# Patient Record
Sex: Male | Born: 1935 | Race: White | Hispanic: No | Marital: Married | Smoking: Never smoker
Health system: Southern US, Community
[De-identification: ages and names within clinical notes are randomized; demographics above are authoritative.]

## PROBLEM LIST (undated history)

## (undated) DIAGNOSIS — E785 Hyperlipidemia, unspecified: Secondary | ICD-10-CM

## (undated) DIAGNOSIS — I10 Essential (primary) hypertension: Secondary | ICD-10-CM

## (undated) DIAGNOSIS — L409 Psoriasis, unspecified: Secondary | ICD-10-CM

## (undated) DIAGNOSIS — H3530 Unspecified macular degeneration: Secondary | ICD-10-CM

## (undated) HISTORY — DX: Psoriasis, unspecified: L40.9

## (undated) HISTORY — DX: Hyperlipidemia, unspecified: E78.5

## (undated) HISTORY — PX: TONSILLECTOMY AND ADENOIDECTOMY: SUR1326

## (undated) HISTORY — PX: OTHER SURGICAL HISTORY: SHX169

## (undated) HISTORY — DX: Unspecified macular degeneration: H35.30

## (undated) HISTORY — PX: INGUINAL HERNIA REPAIR: SHX194

## (undated) HISTORY — DX: Essential (primary) hypertension: I10

---

## 1999-08-07 ENCOUNTER — Ambulatory Visit (HOSPITAL_COMMUNITY): Admission: RE | Admit: 1999-08-07 | Discharge: 1999-08-07 | Payer: Self-pay | Admitting: Gastroenterology

## 1999-11-01 ENCOUNTER — Encounter (HOSPITAL_BASED_OUTPATIENT_CLINIC_OR_DEPARTMENT_OTHER): Payer: Self-pay | Admitting: General Surgery

## 1999-11-04 ENCOUNTER — Inpatient Hospital Stay (HOSPITAL_COMMUNITY): Admission: RE | Admit: 1999-11-04 | Discharge: 1999-11-04 | Payer: Self-pay | Admitting: General Surgery

## 2002-04-08 ENCOUNTER — Emergency Department (HOSPITAL_COMMUNITY): Admission: EM | Admit: 2002-04-08 | Discharge: 2002-04-08 | Payer: Self-pay | Admitting: Emergency Medicine

## 2005-01-13 ENCOUNTER — Ambulatory Visit: Payer: Self-pay | Admitting: Internal Medicine

## 2005-02-17 ENCOUNTER — Ambulatory Visit: Payer: Self-pay | Admitting: Internal Medicine

## 2005-02-26 ENCOUNTER — Ambulatory Visit: Payer: Self-pay | Admitting: Internal Medicine

## 2005-04-22 ENCOUNTER — Ambulatory Visit: Payer: Self-pay | Admitting: Internal Medicine

## 2005-06-17 ENCOUNTER — Ambulatory Visit: Payer: Self-pay | Admitting: Internal Medicine

## 2005-06-27 ENCOUNTER — Ambulatory Visit: Payer: Self-pay | Admitting: Internal Medicine

## 2005-08-21 ENCOUNTER — Ambulatory Visit: Payer: Self-pay | Admitting: Internal Medicine

## 2005-08-26 ENCOUNTER — Ambulatory Visit: Payer: Self-pay | Admitting: Internal Medicine

## 2005-09-08 HISTORY — PX: COLONOSCOPY: SHX174

## 2005-10-16 ENCOUNTER — Ambulatory Visit: Payer: Self-pay | Admitting: Internal Medicine

## 2005-12-16 ENCOUNTER — Ambulatory Visit: Payer: Self-pay | Admitting: Internal Medicine

## 2005-12-25 ENCOUNTER — Ambulatory Visit: Payer: Self-pay | Admitting: Internal Medicine

## 2006-03-25 ENCOUNTER — Ambulatory Visit: Payer: Self-pay | Admitting: Internal Medicine

## 2006-04-01 ENCOUNTER — Ambulatory Visit: Payer: Self-pay | Admitting: Internal Medicine

## 2006-06-03 ENCOUNTER — Ambulatory Visit: Payer: Self-pay | Admitting: Internal Medicine

## 2006-06-10 ENCOUNTER — Ambulatory Visit: Payer: Self-pay | Admitting: Internal Medicine

## 2006-07-01 ENCOUNTER — Ambulatory Visit: Payer: Self-pay | Admitting: Gastroenterology

## 2006-07-15 ENCOUNTER — Ambulatory Visit: Payer: Self-pay | Admitting: Gastroenterology

## 2006-11-23 DIAGNOSIS — L408 Other psoriasis: Secondary | ICD-10-CM

## 2006-11-24 ENCOUNTER — Ambulatory Visit: Payer: Self-pay | Admitting: Internal Medicine

## 2006-11-24 LAB — CONVERTED CEMR LAB
ALT: 18 units/L (ref 0–40)
AST: 27 units/L (ref 0–37)
Cholesterol: 171 mg/dL (ref 0–200)
HDL: 42.1 mg/dL (ref >39.0)
LDL Cholesterol: 118 mg/dL — ABNORMAL HIGH (ref 0–99)
PSA: 1.04 ng/mL (ref 0.10–4.00)
TSH: 7.6 microintl units/mL — ABNORMAL HIGH (ref 0.35–5.50)
Total CHOL/HDL Ratio: 4.1
Total CK: 143 units/L (ref 7–195)
Triglycerides: 56 mg/dL (ref 0–149)
VLDL: 11 mg/dL (ref 0–40)

## 2007-01-13 DIAGNOSIS — Z9089 Acquired absence of other organs: Secondary | ICD-10-CM

## 2007-02-15 ENCOUNTER — Encounter: Payer: Self-pay | Admitting: Internal Medicine

## 2007-02-16 ENCOUNTER — Encounter: Payer: Self-pay | Admitting: Internal Medicine

## 2007-04-05 ENCOUNTER — Ambulatory Visit: Payer: Self-pay | Admitting: Internal Medicine

## 2007-04-05 LAB — CONVERTED CEMR LAB
Cholesterol, target level: 200 mg/dL
HDL goal, serum: 40 mg/dL
LDL Goal: 100 mg/dL

## 2007-04-08 ENCOUNTER — Encounter: Payer: Self-pay | Admitting: Internal Medicine

## 2007-04-12 ENCOUNTER — Encounter (INDEPENDENT_AMBULATORY_CARE_PROVIDER_SITE_OTHER): Payer: Self-pay | Admitting: *Deleted

## 2007-04-12 LAB — CONVERTED CEMR LAB
BUN: 13 mg/dL (ref 6–23)
Creatinine, Ser: 0.9 mg/dL (ref 0.4–1.5)
Potassium: 4.1 meq/L (ref 3.5–5.1)
TSH: 38.03 microintl units/mL — ABNORMAL HIGH (ref 0.35–5.50)

## 2007-04-27 ENCOUNTER — Telehealth (INDEPENDENT_AMBULATORY_CARE_PROVIDER_SITE_OTHER): Payer: Self-pay | Admitting: *Deleted

## 2007-04-29 ENCOUNTER — Encounter: Payer: Self-pay | Admitting: Internal Medicine

## 2007-05-19 ENCOUNTER — Encounter (INDEPENDENT_AMBULATORY_CARE_PROVIDER_SITE_OTHER): Payer: Self-pay | Admitting: *Deleted

## 2007-05-25 ENCOUNTER — Encounter: Payer: Self-pay | Admitting: Internal Medicine

## 2007-06-21 ENCOUNTER — Ambulatory Visit: Payer: Self-pay | Admitting: Internal Medicine

## 2007-06-21 LAB — CONVERTED CEMR LAB: TSH: 20.5 microintl units/mL — ABNORMAL HIGH (ref 0.35–5.50)

## 2007-06-22 ENCOUNTER — Encounter (INDEPENDENT_AMBULATORY_CARE_PROVIDER_SITE_OTHER): Payer: Self-pay | Admitting: *Deleted

## 2007-08-27 ENCOUNTER — Telehealth (INDEPENDENT_AMBULATORY_CARE_PROVIDER_SITE_OTHER): Payer: Self-pay | Admitting: *Deleted

## 2007-10-18 ENCOUNTER — Ambulatory Visit: Payer: Self-pay | Admitting: Internal Medicine

## 2007-10-18 LAB — CONVERTED CEMR LAB
ALT: 22 units/L (ref 0–53)
AST: 25 units/L (ref 0–37)
Albumin: 4 g/dL (ref 3.5–5.2)
Alkaline Phosphatase: 78 units/L (ref 39–117)
BUN: 13 mg/dL (ref 6–23)
Bilirubin, Direct: 0.1 mg/dL (ref 0.0–0.3)
CO2: 29 meq/L (ref 19–32)
Calcium: 9.8 mg/dL (ref 8.4–10.5)
Chloride: 104 meq/L (ref 96–112)
Cholesterol: 151 mg/dL (ref 0–200)
Creatinine, Ser: 0.9 mg/dL (ref 0.4–1.5)
GFR calc Af Amer: 105 mL/min
GFR calc non Af Amer: 87 mL/min
Glucose, Bld: 126 mg/dL — ABNORMAL HIGH (ref 70–99)
HDL: 35.6 mg/dL — ABNORMAL LOW (ref >39.0)
LDL Cholesterol: 104 mg/dL — ABNORMAL HIGH (ref 0–99)
Potassium: 4.2 meq/L (ref 3.5–5.1)
Sodium: 141 meq/L (ref 135–145)
TSH: 26.21 microintl units/mL — ABNORMAL HIGH (ref 0.35–5.50)
Total Bilirubin: 0.8 mg/dL (ref 0.3–1.2)
Total CHOL/HDL Ratio: 4.2
Total Protein: 7.5 g/dL (ref 6.0–8.3)
Triglycerides: 58 mg/dL (ref 0–149)
VLDL: 12 mg/dL (ref 0–40)

## 2007-10-25 ENCOUNTER — Ambulatory Visit: Payer: Self-pay | Admitting: Internal Medicine

## 2007-10-25 DIAGNOSIS — I10 Essential (primary) hypertension: Secondary | ICD-10-CM | POA: Insufficient documentation

## 2007-10-25 DIAGNOSIS — E782 Mixed hyperlipidemia: Secondary | ICD-10-CM | POA: Insufficient documentation

## 2007-10-25 DIAGNOSIS — E039 Hypothyroidism, unspecified: Secondary | ICD-10-CM | POA: Insufficient documentation

## 2007-11-01 ENCOUNTER — Ambulatory Visit: Payer: Self-pay | Admitting: Internal Medicine

## 2007-11-01 ENCOUNTER — Encounter (INDEPENDENT_AMBULATORY_CARE_PROVIDER_SITE_OTHER): Payer: Self-pay | Admitting: *Deleted

## 2007-11-01 LAB — CONVERTED CEMR LAB
Creatinine,U: 108.8 mg/dL
Hgb A1c MFr Bld: 5.6 % (ref 4.6–6.0)
Microalb, Ur: 0.6 mg/dL (ref 0.0–1.9)

## 2007-11-02 ENCOUNTER — Encounter (INDEPENDENT_AMBULATORY_CARE_PROVIDER_SITE_OTHER): Payer: Self-pay | Admitting: *Deleted

## 2008-01-20 ENCOUNTER — Encounter: Payer: Self-pay | Admitting: Internal Medicine

## 2008-02-21 ENCOUNTER — Ambulatory Visit: Payer: Self-pay | Admitting: Internal Medicine

## 2008-03-01 ENCOUNTER — Encounter (INDEPENDENT_AMBULATORY_CARE_PROVIDER_SITE_OTHER): Payer: Self-pay | Admitting: *Deleted

## 2008-03-01 LAB — CONVERTED CEMR LAB: Hgb A1c MFr Bld: 5.7 % (ref 4.6–6.0)

## 2008-03-03 ENCOUNTER — Ambulatory Visit: Payer: Self-pay | Admitting: Internal Medicine

## 2008-04-21 ENCOUNTER — Ambulatory Visit: Payer: Self-pay | Admitting: Internal Medicine

## 2008-04-28 ENCOUNTER — Ambulatory Visit: Payer: Self-pay | Admitting: Internal Medicine

## 2008-06-30 ENCOUNTER — Ambulatory Visit: Payer: Self-pay | Admitting: Internal Medicine

## 2008-07-04 ENCOUNTER — Encounter (INDEPENDENT_AMBULATORY_CARE_PROVIDER_SITE_OTHER): Payer: Self-pay | Admitting: *Deleted

## 2008-07-07 ENCOUNTER — Ambulatory Visit: Payer: Self-pay | Admitting: Internal Medicine

## 2008-07-07 DIAGNOSIS — T887XXA Unspecified adverse effect of drug or medicament, initial encounter: Secondary | ICD-10-CM

## 2008-07-26 ENCOUNTER — Encounter: Admission: RE | Admit: 2008-07-26 | Discharge: 2008-07-26 | Payer: Self-pay | Admitting: Internal Medicine

## 2008-07-26 ENCOUNTER — Telehealth (INDEPENDENT_AMBULATORY_CARE_PROVIDER_SITE_OTHER): Payer: Self-pay | Admitting: *Deleted

## 2008-09-15 ENCOUNTER — Ambulatory Visit: Payer: Self-pay | Admitting: Internal Medicine

## 2008-09-15 LAB — CONVERTED CEMR LAB
Eosinophils Absolute: 0.2 10*3/uL (ref 0.0–0.7)
Eosinophils Relative: 3.1 % (ref 0.0–5.0)
HCT: 36.9 % — ABNORMAL LOW (ref 39.0–52.0)
Hemoglobin: 13 g/dL (ref 13.0–17.0)
MCV: 84.5 fL (ref 78.0–100.0)
Monocytes Absolute: 0.9 10*3/uL (ref 0.1–1.0)
Neutro Abs: 4.5 10*3/uL (ref 1.4–7.7)
Platelets: 222 10*3/uL (ref 150–400)
TSH: 17.36 microintl units/mL — ABNORMAL HIGH (ref 0.35–5.50)
WBC: 6.7 10*3/uL (ref 4.5–10.5)

## 2008-09-22 ENCOUNTER — Ambulatory Visit: Payer: Self-pay | Admitting: Internal Medicine

## 2008-09-22 DIAGNOSIS — D649 Anemia, unspecified: Secondary | ICD-10-CM

## 2008-09-22 DIAGNOSIS — R1319 Other dysphagia: Secondary | ICD-10-CM

## 2008-09-27 ENCOUNTER — Ambulatory Visit: Payer: Self-pay | Admitting: Endocrinology

## 2008-09-27 DIAGNOSIS — E079 Disorder of thyroid, unspecified: Secondary | ICD-10-CM | POA: Insufficient documentation

## 2008-09-27 DIAGNOSIS — E236 Other disorders of pituitary gland: Secondary | ICD-10-CM | POA: Insufficient documentation

## 2008-09-27 LAB — CONVERTED CEMR LAB
Free T4: 2.8 ng/dL — ABNORMAL HIGH (ref 0.6–1.6)
T4, Total: 17.8 ug/dL — ABNORMAL HIGH (ref 5.0–12.5)

## 2008-09-28 ENCOUNTER — Encounter (INDEPENDENT_AMBULATORY_CARE_PROVIDER_SITE_OTHER): Payer: Self-pay | Admitting: *Deleted

## 2008-09-28 ENCOUNTER — Ambulatory Visit: Payer: Self-pay | Admitting: Internal Medicine

## 2008-09-28 ENCOUNTER — Telehealth (INDEPENDENT_AMBULATORY_CARE_PROVIDER_SITE_OTHER): Payer: Self-pay | Admitting: *Deleted

## 2008-09-28 LAB — CONVERTED CEMR LAB: OCCULT 1: NEGATIVE

## 2008-09-29 ENCOUNTER — Telehealth: Payer: Self-pay | Admitting: Endocrinology

## 2008-10-01 LAB — CONVERTED CEMR LAB
Saturation Ratios: 33.7 % (ref 20.0–50.0)
Transferrin: 211.7 mg/dL — ABNORMAL LOW (ref >=212.0)

## 2008-10-02 ENCOUNTER — Encounter (INDEPENDENT_AMBULATORY_CARE_PROVIDER_SITE_OTHER): Payer: Self-pay | Admitting: *Deleted

## 2008-10-02 ENCOUNTER — Ambulatory Visit: Payer: Self-pay | Admitting: Endocrinology

## 2008-10-03 ENCOUNTER — Telehealth: Payer: Self-pay | Admitting: Endocrinology

## 2008-10-04 ENCOUNTER — Ambulatory Visit: Payer: Self-pay | Admitting: Endocrinology

## 2008-10-06 ENCOUNTER — Encounter: Admission: RE | Admit: 2008-10-06 | Discharge: 2008-10-06 | Payer: Self-pay | Admitting: Endocrinology

## 2008-10-13 LAB — CONVERTED CEMR LAB
BUN: 19 mg/dL (ref 6–23)
CO2: 26 meq/L (ref 19–32)
Calcium: 9.8 mg/dL (ref 8.4–10.5)
Creatinine, Ser: 0.9 mg/dL (ref 0.4–1.5)
GFR calc Af Amer: 105 mL/min
Glucose, Bld: 129 mg/dL — ABNORMAL HIGH (ref 70–99)
Sodium: 141 meq/L (ref 135–145)

## 2008-10-14 ENCOUNTER — Encounter: Payer: Self-pay | Admitting: Internal Medicine

## 2008-10-16 ENCOUNTER — Encounter (INDEPENDENT_AMBULATORY_CARE_PROVIDER_SITE_OTHER): Payer: Self-pay | Admitting: *Deleted

## 2008-12-20 ENCOUNTER — Telehealth (INDEPENDENT_AMBULATORY_CARE_PROVIDER_SITE_OTHER): Payer: Self-pay | Admitting: *Deleted

## 2008-12-21 ENCOUNTER — Telehealth: Payer: Self-pay | Admitting: Internal Medicine

## 2009-01-03 ENCOUNTER — Encounter: Payer: Self-pay | Admitting: Internal Medicine

## 2009-01-11 IMAGING — US US SOFT TISSUE HEAD/NECK
1 series · 14 of 25 positions shown · non-contrast
Comparison: None

CLINICAL DATA: Hypothyroidism.

THYROID ULTRASOUND
TECHNIQUE: Ultrasound examination of the thyroid gland and
adjacent soft tissues was performed.

[Series 1: us soft tissue head/neck · 0.05mm/px · 14 of 29 slices shown]
[im 1/29]
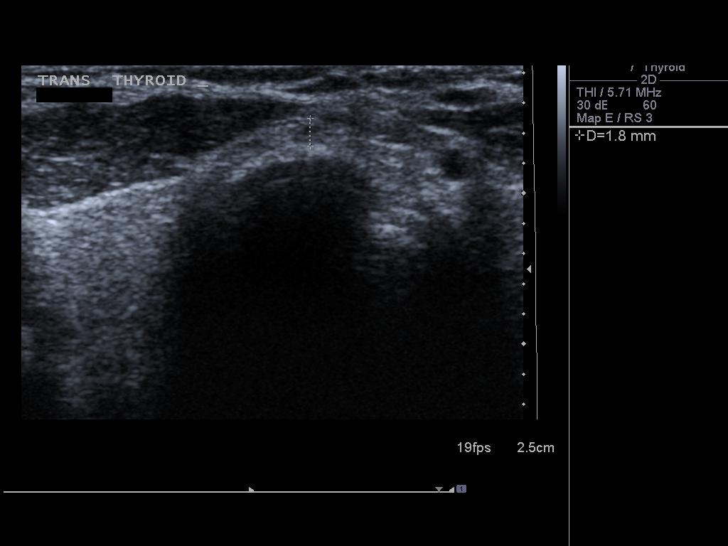
[im 3/29]
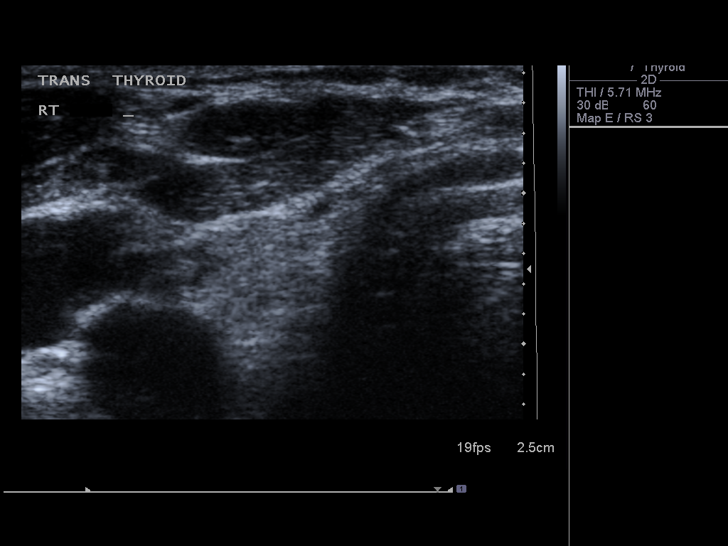
[im 5/29]
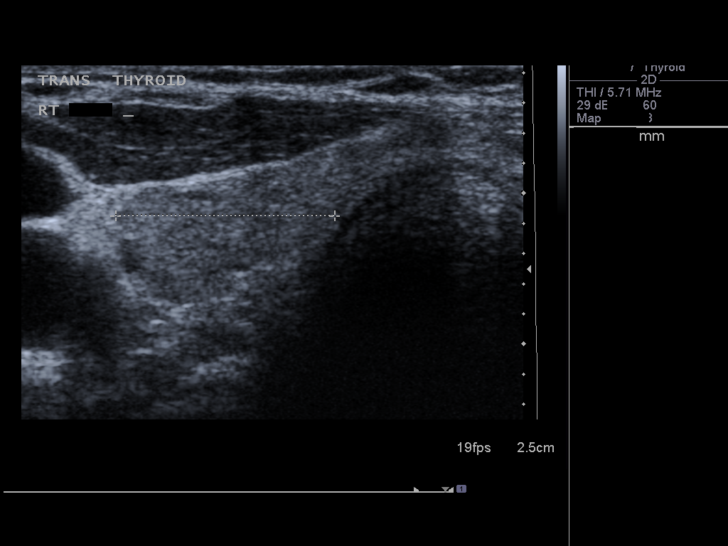
[im 8/29]
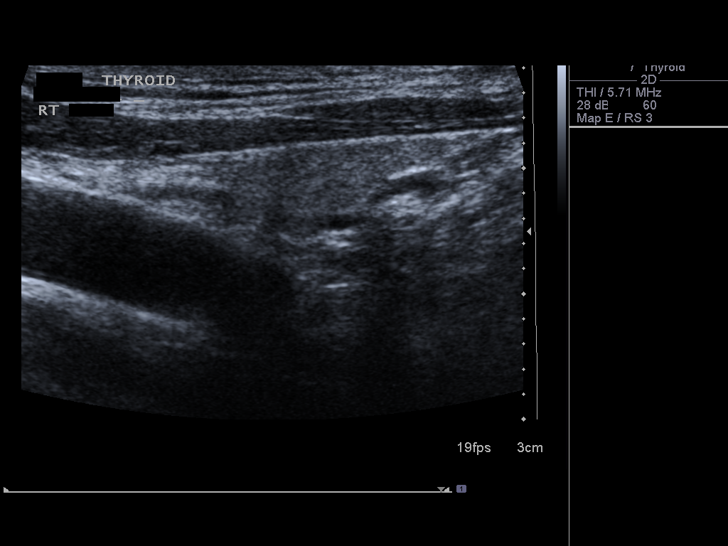
[im 10/29]
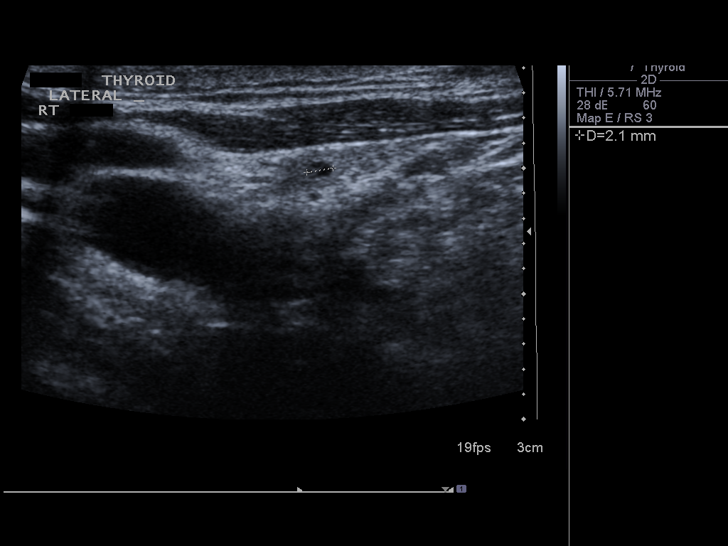
[im 11/29]
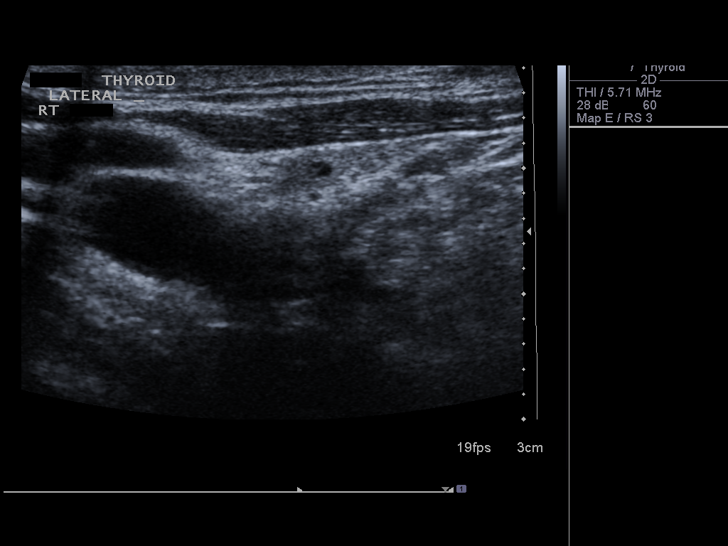
[im 13/29]
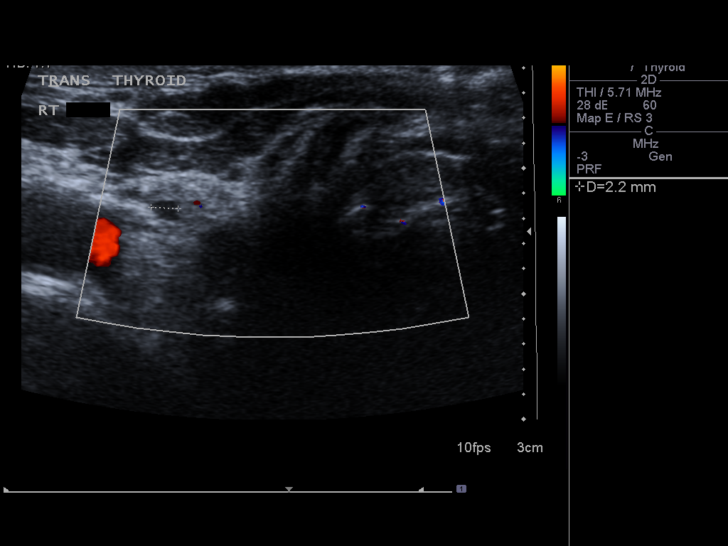
[im 16/29]
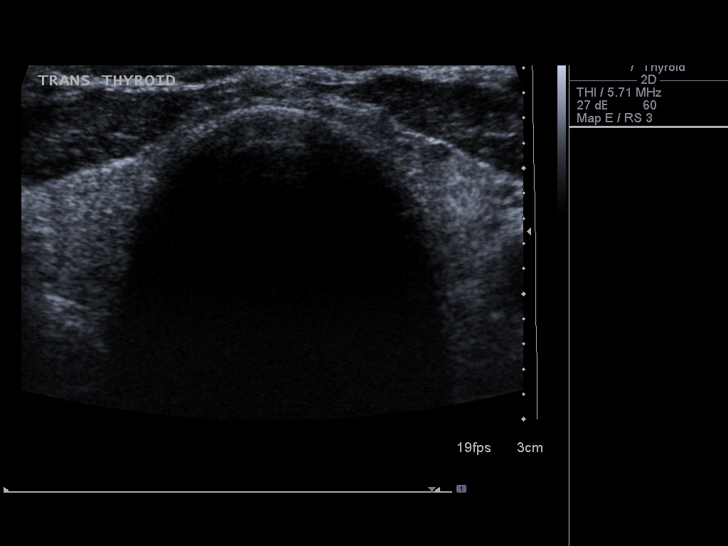
[im 18/29]
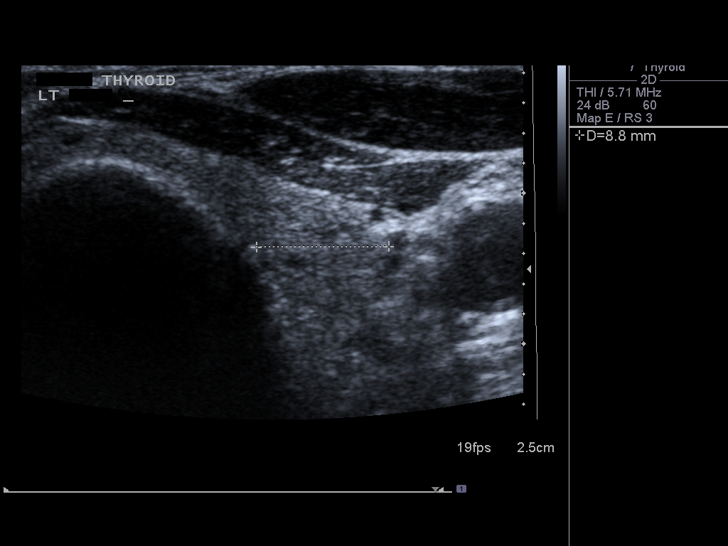
[im 19/29]
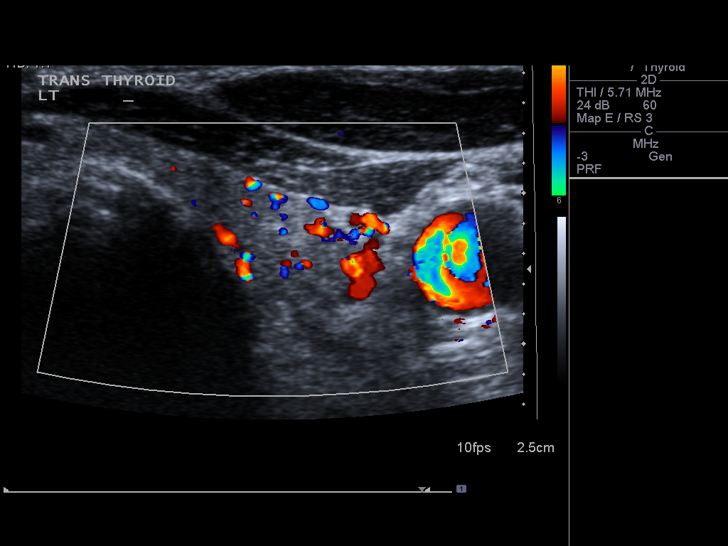
[im 22/29]
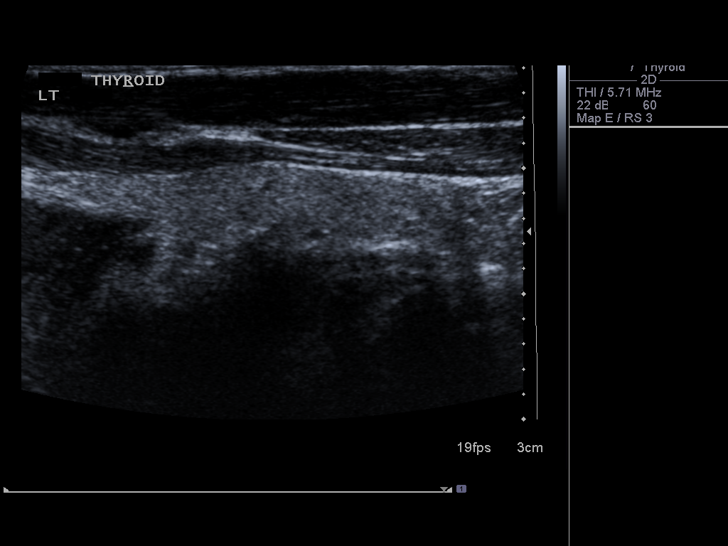
[im 24/29]
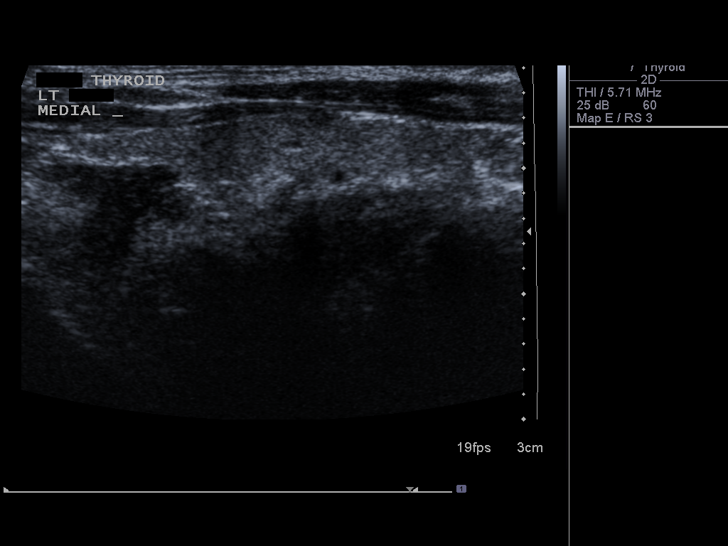
[im 26/29]
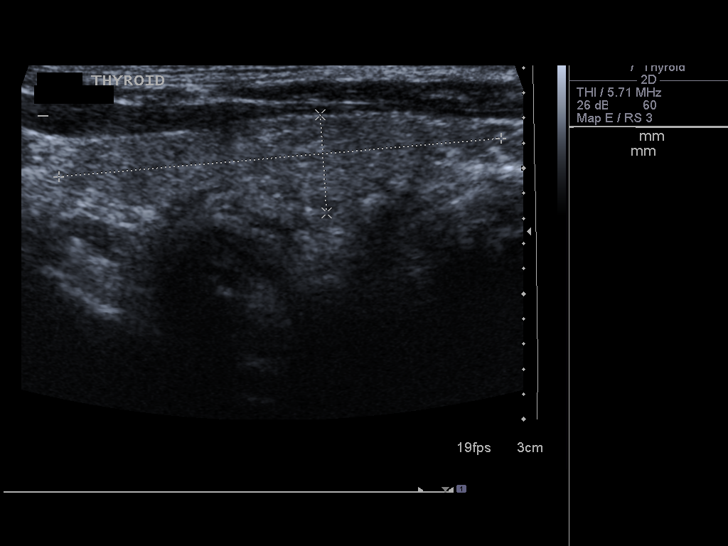
[im 29/29]
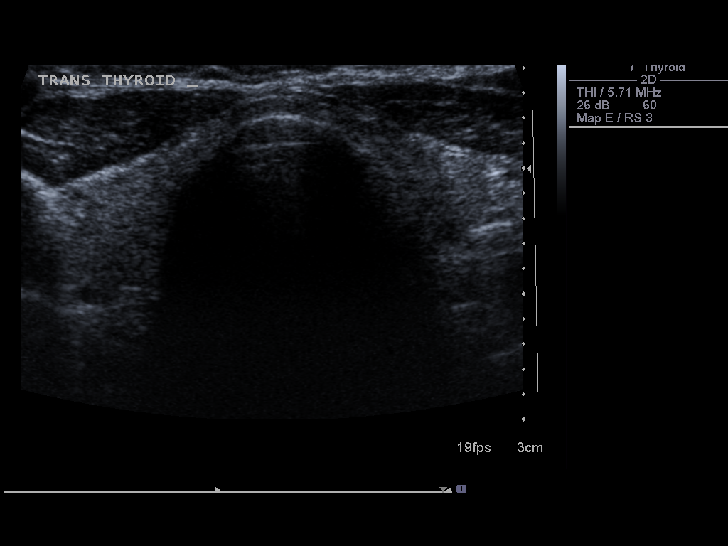

[14 of 25 positions shown; findings below may reference images not displayed]

FINDINGS: Right lobe of the thyroid measures 4.1 x 1.0 x 1.5 cm and
left lobe, 3.6 x 0.8 x 1.0 cm.  Isthmus measures 2 mm.  Scattered
hypoechoic nodules measure 3 mm or less in size.
IMPRESSION: Tiny scattered thyroid nodules.

## 2009-01-12 ENCOUNTER — Encounter: Payer: Self-pay | Admitting: Internal Medicine

## 2009-02-20 ENCOUNTER — Encounter: Payer: Self-pay | Admitting: Internal Medicine

## 2009-03-02 ENCOUNTER — Encounter: Payer: Self-pay | Admitting: Internal Medicine

## 2009-04-04 ENCOUNTER — Encounter: Payer: Self-pay | Admitting: Internal Medicine

## 2009-05-23 ENCOUNTER — Encounter: Payer: Self-pay | Admitting: Internal Medicine

## 2009-06-10 ENCOUNTER — Encounter: Payer: Self-pay | Admitting: Internal Medicine

## 2009-06-21 ENCOUNTER — Encounter: Payer: Self-pay | Admitting: Internal Medicine

## 2009-06-27 ENCOUNTER — Encounter: Payer: Self-pay | Admitting: Internal Medicine

## 2009-08-27 ENCOUNTER — Encounter: Payer: Self-pay | Admitting: Internal Medicine

## 2009-08-28 ENCOUNTER — Encounter: Payer: Self-pay | Admitting: Internal Medicine

## 2009-11-29 ENCOUNTER — Encounter: Payer: Self-pay | Admitting: Internal Medicine

## 2009-12-03 ENCOUNTER — Encounter: Payer: Self-pay | Admitting: Internal Medicine

## 2009-12-03 ENCOUNTER — Encounter (INDEPENDENT_AMBULATORY_CARE_PROVIDER_SITE_OTHER): Payer: Self-pay | Admitting: *Deleted

## 2009-12-04 ENCOUNTER — Ambulatory Visit: Payer: Self-pay | Admitting: Internal Medicine

## 2009-12-04 LAB — CONVERTED CEMR LAB
BUN: 13 mg/dL (ref 6–23)
Bilirubin, Direct: 0 mg/dL (ref 0.0–0.3)
Chloride: 103 meq/L (ref 96–112)
GFR calc non Af Amer: 76.29 mL/min (ref >60)
Glucose, Bld: 97 mg/dL (ref 70–99)
HDL: 42.2 mg/dL (ref >39.00)
LDL Cholesterol: 88 mg/dL (ref 0–99)
Potassium: 4 meq/L (ref 3.5–5.1)
Sodium: 141 meq/L (ref 135–145)
Total Bilirubin: 0.7 mg/dL (ref 0.3–1.2)
Total CHOL/HDL Ratio: 4
VLDL: 19.4 mg/dL (ref 0.0–40.0)

## 2009-12-12 ENCOUNTER — Ambulatory Visit: Payer: Self-pay | Admitting: Internal Medicine

## 2009-12-18 ENCOUNTER — Encounter: Payer: Self-pay | Admitting: Internal Medicine

## 2009-12-18 LAB — CONVERTED CEMR LAB
Free T4: 1.1 ng/dL (ref 0.6–1.6)
T3, Free: 2.7 pg/mL (ref 2.3–4.2)
TSH: 12.47 microintl units/mL — ABNORMAL HIGH (ref 0.35–5.50)

## 2010-01-28 ENCOUNTER — Telehealth: Payer: Self-pay | Admitting: Internal Medicine

## 2010-03-19 ENCOUNTER — Encounter: Payer: Self-pay | Admitting: Internal Medicine

## 2010-05-27 ENCOUNTER — Encounter: Payer: Self-pay | Admitting: Internal Medicine

## 2010-06-04 ENCOUNTER — Telehealth (INDEPENDENT_AMBULATORY_CARE_PROVIDER_SITE_OTHER): Payer: Self-pay | Admitting: *Deleted

## 2010-09-24 ENCOUNTER — Encounter: Payer: Self-pay | Admitting: Internal Medicine

## 2010-09-29 ENCOUNTER — Encounter: Payer: Self-pay | Admitting: Endocrinology

## 2010-10-10 NOTE — Letter (Signed)
Summary: [facility]  [facility]   Imported By: Lanelle Bal 06/10/2010 09:16:13  _____________________________________________________________________  External Attachment:    Type:   Image     Comment:   External Document

## 2010-10-10 NOTE — Letter (Signed)
Summary: [facility]  [facility]   Imported By: Lanelle Bal 12/11/2009 08:24:21  _____________________________________________________________________  External Attachment:    Type:   Image     Comment:   External Document

## 2010-10-10 NOTE — Assessment & Plan Note (Signed)
Summary: discuss lans/kdc   Vital Signs:  Patient profile:   77 year old male Weight:      168 pounds Pulse rate:   56 / minute Resp:     15 per minute BP sitting:   124 / 70  (left arm) Cuff size:   regular  Vitals Entered By: Shonna Chock (December 12, 2009 9:15 AM) CC: Follow-up visit: Discuss labs (copy given). Refill Gemfibrozil Comments REVIEWED MED LIST, PATIENT AGREED DOSE AND INSTRUCTION CORRECT    CC:  Follow-up visit: Discuss labs (copy given). Refill Gemfibrozil.  History of Present Illness: Ethan Duncan is asymptomatic; labs reviewed & risks discussed. All labs @ goal except TSH 12.45. TSH was 0.87 in 08/2009 @ [facility] TSH from 11/29/2009 is pending. That OV with Dr Dorothyann Gibbs / Lyda Perone  was reviewed. Last thyroid  dose adjustment in 08/2009.  Allergies: 1)  ! * Asprin-Too Much  Review of Systems General:  Denies fatigue and weight loss. Eyes:  Denies blurring, double vision, and vision loss-both eyes. ENT:  Denies difficulty swallowing and hoarseness. CV:  Denies chest pain or discomfort and palpitations. GI:  Denies constipation and diarrhea. MS:  Complains of joint pain; denies joint redness, joint swelling, muscle aches, cramps, and muscle weakness. Derm:  Denies changes in nail beds, dryness, and hair loss. Neuro:  Denies numbness and tingling. Endo:  Denies cold intolerance and heat intolerance.  Physical Exam  General:  well-nourishedappears younger than age; alert,appropriate and cooperative throughout examination Head:  Normocephalic and atraumatic without obvious abnormalities. Pattern  alopecia  Neck:  No deformities, masses, or tenderness noted. Lungs:  Normal respiratory effort, chest expands symmetrically. Lungs are clear to auscultation, no crackles or wheezes. Heart:  normal rate, regular rhythm, no gallop, no rub, no JVD, and grade 1 /6 systolic murmur.   Extremities:  No clubbing, cyanosis, edema.OA of hands. No onycholysis Neurologic:  alert & oriented X3,  gait normal, and DTRs symmetrical and normal.  No tremor Skin:  Intact without suspicious lesions or rashes Psych:  memory intact for recent and remote, normally interactive, and good eye contact.     Impression & Recommendations:  Problem # 1:  HYPERTENSION, ESSENTIAL NOS (ICD-401.9) controlled His updated medication list for this problem includes:    Metoprolol Tartrate 50 Mg Tabs (Metoprolol tartrate) .Marland Kitchen... 1/2 tab bid    Enalapril Maleate 20 Mg Tabs (Enalapril maleate) .Marland Kitchen... 2 by mouth once daily  Problem # 2:  UNSPECIFIED DISORDER OF THYROID (ICD-246.9)  TSH elevated  Orders: Venipuncture ([address]) TLB-TSH (Thyroid Stimulating Hormone) (84443-TSH) TLB-T4 (Thyrox), Free [phone]) TLB-T3, Free (Triiodothyronine) (84481-T3FREE)  Problem # 3:  HYPERLIPIDEMIA (ICD-272.2) Lipids @ goal His updated medication list for this problem includes:    Gemfibrozil 600 Mg Tabs (Gemfibrozil) .Marland Kitchen... 1 by mouth two times a day    Lipitor 20 Mg Tabs (Atorvastatin calcium) .Marland Kitchen... 1/2 tab m,w,f,sun  Problem # 4:  UNS ADVRS EFF UNS RX MEDICINAL&BIOLOGICAL SBSTNC (ICD-995.20) Combined statin & Gemfibrozil Rx Orders: TLB-CK Total Only(Creatine Kinase/CPK) (82550-CK)  Complete Medication List: 1)  Metoprolol Tartrate 50 Mg Tabs (Metoprolol tartrate) .... 1/2 tab bid 2)  Gemfibrozil 600 Mg Tabs (Gemfibrozil) .Marland Kitchen.. 1 by mouth two times a day 3)  Lipitor 20 Mg Tabs (Atorvastatin calcium) .... 1/2 tab m,w,f,sun 4)  Flax Seed Oil  5)  Occuvite  .Marland Kitchen.. 2 tabs bid 6)  Enalapril Maleate 20 Mg Tabs (Enalapril maleate) .... 2 by mouth once daily 7)  Synthroid 300 Mcg Tabs (Levothyroxine sodium) .Marland KitchenMarland KitchenMarland Kitchen  1 by mouth once daily 8)  Triazolam 0.25 Mg Tabs (Triazolam) .... One dose before mri  Patient Instructions: 1)  Thyroid function status will be verified with complete panel. Prescriptions: GEMFIBROZIL 600 MG TABS (GEMFIBROZIL) 1 by mouth two times a day  #180 x 1   Entered and Authorized by:   Marga Melnick  MD   Signed by:   Marga Melnick MD on 12/12/2009   Method used:   Faxed to ...       [facility]  [address] # [id number]* (retail)       [address]       [place], [place]  [address]       Ph: [phone] or [phone]       Fax: [phone]   RxID:   [id number]

## 2010-10-10 NOTE — Letter (Signed)
Summary: [facility] with Lab Results/[facility]  [facility]   Imported By: Lanelle Bal 12/24/2009 10:45:16  _____________________________________________________________________  External Attachment:    Type:   Image     Comment:   External Document

## 2010-10-10 NOTE — Letter (Signed)
Summary: [facility]   Imported By: Lanelle Bal 12/26/2009 13:26:53  _____________________________________________________________________  External Attachment:    Type:   Image     Comment:   External Document

## 2010-10-10 NOTE — Letter (Signed)
Summary: [facility] with Lab Results/[facility]  [facility]   Imported By: Lanelle Bal 12/24/2009 10:44:26  _____________________________________________________________________  External Attachment:    Type:   Image     Comment:   External Document

## 2010-10-10 NOTE — Letter (Signed)
Summary: [facility]  [facility]   Imported By: Lanelle Bal 12/24/2009 10:43:44  _____________________________________________________________________  External Attachment:    Type:   Image     Comment:   External Document

## 2010-10-10 NOTE — Letter (Signed)
Summary: [facility]  [facility]   Imported By: Lanelle Bal 12/24/2009 10:46:46  _____________________________________________________________________  External Attachment:    Type:   Image     Comment:   External Document

## 2010-10-10 NOTE — Letter (Signed)
Summary: [facility]  [facility]   Imported By: Lanelle Bal 12/24/2009 10:46:08  _____________________________________________________________________  External Attachment:    Type:   Image     Comment:   External Document

## 2010-10-10 NOTE — Progress Notes (Signed)
Summary: refill  Phone Note Refill Request Message from:  Fax from Pharmacy on June 04, 2010 4:41 PM  Refills Requested: Medication #1:  GEMFIBROZIL 600 MG TABS 1 by mouth two times a day [facility] [address] - fax [phone]  Initial call taken by: Okey Regal Spring,  June 04, 2010 4:42 PM  Follow-up for Phone Call        already done...............Marland KitchenFelecia Deloach CMA  June 04, 2010 5:17 PM

## 2010-10-10 NOTE — Letter (Signed)
Summary: [facility]   Imported By: Lanelle Bal 07/09/2010 09:09:48  _____________________________________________________________________  External Attachment:    Type:   Image     Comment:   External Document

## 2010-10-10 NOTE — Letter (Signed)
Summary: [facility]  [facility]   Imported By: Lanelle Bal 09/20/2009 09:33:45  _____________________________________________________________________  External Attachment:    Type:   Image     Comment:   External Document

## 2010-10-10 NOTE — Letter (Signed)
Summary: Primary Care Appointment Letter  [facility] [place]  [address] [place], [place] [address]   Phone: [phone]  Fax: [phone]    12/03/2009 MRN: [id number]  [facility] [address] [place], [place]  [address]  Dear Ethan Duncan,   Your Primary Care Physician Marga Melnick MD has indicated that:    ____X___it is time to schedule an appointment FOR OFFICE VISIT AND FASTING LABS. LAST OV WAS 09/22/08.   _______you missed your appointment on______ and need to call and          reschedule.    _______you need to have lab work done.    _______you need to schedule an appointment discuss lab or test results.    _______you need to call to reschedule your appointment that is                       scheduled on _________.     Please call our office as soon as possible. Our phone number is [phone]_____. Our office is open 8a-5p, Monday through Friday.     Thank you,    [facility] Scheduler

## 2010-10-10 NOTE — Progress Notes (Signed)
Summary: [facility] STATUS  Phone Note Call from Patient Call back at Home Phone [phone]   Caller: Patient Summary of Call: Pt left VM that he was seen in UC for pain in leg which was later found to be a hematoma from pull muscle in his calf. Pt states that he was referred to orth and has to wear a boot for 6 weeks. pt just wanted to let you know....Marland KitchenMarland KitchenFelecia Deloach CMA  Jan 28, 2010 11:13 AM   Follow-up for Phone Call        OK; thanks for heads up. I have not heard from Dr Lyda Perone , [facility] Endo about high TSH (X2). Please re FAX  OV & labs to Dr Rodney Langton, [facility] Endo (MR # [id number] [id number]) Follow-up by: Marga Melnick MD,  Jan 29, 2010 5:57 AM  Additional Follow-up for Phone Call Additional follow up Details #1::        re FAX  OV & labs to Dr Rodney Langton, [facility] Endo @ [phone].............Marland KitchenFelecia Deloach CMA  Jan 29, 2010 8:32 AM

## 2010-10-30 NOTE — Letter (Signed)
Summary: [facility]  [facility]   Imported By: Maryln Gottron 10/23/2010 15:02:46  _____________________________________________________________________  External Attachment:    Type:   Image     Comment:   External Document

## 2010-12-02 ENCOUNTER — Other Ambulatory Visit: Payer: Self-pay | Admitting: Internal Medicine

## 2010-12-23 ENCOUNTER — Other Ambulatory Visit: Payer: Self-pay | Admitting: Internal Medicine

## 2010-12-23 NOTE — Telephone Encounter (Signed)
CPX (30 min yearly appointment due)

## 2011-01-24 NOTE — Op Note (Signed)
[facility]  Patient:    THIERNO, HUN                       MRN: [id number] Proc. Date: 11/04/99 Adm. Date:  [id number] Attending:  Sonda Primes Dictator:   Mardene Celeste Lurene Shadow, M.D. CC:         Mardene Celeste. Lurene Shadow, M.D. 2 copies                           Operative Report  PREOPERATIVE DIAGNOSIS:  Bilateral inguinal hernias.  POSTOPERATIVE DIAGNOSIS:  Bilateral inguinal hernias.  PROCEDURE:  Bilateral inguinal hernia repairs with mesh.  SURGEON:  Dr. Lurene Shadow.  ASSISTANT:  Marnee Spring. Wiliam Ke, M.D.  ANESTHESIA:  General.  INDICATIONS:  This patient is a 77 year old gentleman presenting with a very large right-sided inguinal hernia originally, but this has been causing some pain and  discomfort.  Also on examination is noted to have a relatively large left-sided  inguinal hernia.  He is brought to the operating room now for a bilateral repair.  DESCRIPTION OF PROCEDURE:  Following the induction of satisfactory general anesthesia with the patient positioned supinely the lower abdomen and pelvic area were prepped and draped to be included in the sterile operative field.  A transverse incision on the right side where a very large hernia located was deepened through the skin and subcutaneous tissues down to the external oblique  aponeurosis.  This was entered through the external inguinal ring with mobilization and elevation of the spermatic cord and retraction of the ilioinguinal nerve inferiorly and laterally.  A very large indirect inguinal hernia sac was dissected free from the cord, and examination of the retroperitoneum there was no indirect hernia noted.  A patch of Prolene mesh was then sewn in the pubic tubercle. Carried up along the conjoin tendons of the internal ring, and again from the pubic tubercle up along the shelving edge of Pouparts ligament to the internal ring.  The mesh was split so as to allow the protrusion of  the cord between the mesh at the internal ring, and the tails of the mesh were then trimmed and sutured down to the internal ring muscles with repairs noted to be intact.  A gauze sponge was hen placed in the wound, and attention then turned to the left side.  A mirror image low transverse incision was made on the left side deep into the skin and subcutaneous tissues to the external oblique aponeurosis.  This was opened up through the external inguinal ring and the spermatic cord was elevated with a Penrose drain, and the ilioinguinal nerve retracted medially and superiorly. Again, there was a large lipoma on the left side which was dissected free from he cord and reduced into the retroperitoneum.  There was no indirect sac.  A large  direct sac was dissected free from the spermatic cord and reduced into the retroperitoneum.  This was then closed with an onlay patch of Prolene mesh, sewn up to the pubic tubercle with a 2-0 Novofil suture, then continued up along the conjoin tendon to the internal ring, and again from the pubic tubercle up along the shelving edge of the Pouparts ligament to the internal ring.  Again, the mesh was split to allow the cord to protrude at the internal ring, and the tails of the esh were trimmed and sutured to the internal oblique muscle behind the cord.  Other  areas of dissection were then checked for hemostasis in both wounds.  Sponge, instrument, and Sharp counts were verified.  The wounds were closed in layers as follows:  External oblique aponeurosis was closed over the cord with running 2-0 Vicryl suture, Scarpas fascia closed with a running 3-0 Vicryl suture, and the skin closed with a 4-0 Monocryl suture.  Both wounds were closed in the same fashion.  Wounds were then reinforced with Steri-Strips and a sterile dressing as applied.  Anesthetic was reversed.  The patient was removed from the operating oom to the recovery room in stable  condition having tolerated the procedure well. DD:  11/04/99 TD:  11/04/99 Job: [id number]

## 2011-03-04 ENCOUNTER — Other Ambulatory Visit: Payer: Self-pay | Admitting: Internal Medicine

## 2011-03-04 NOTE — Telephone Encounter (Signed)
Voicemail has not been set up on mobile number yet. Left message at home to call and schedule fasting appt with labs.

## 2011-03-04 NOTE — Telephone Encounter (Signed)
Patient has appt on 7/6 at 9:00

## 2011-03-11 ENCOUNTER — Encounter: Payer: Self-pay | Admitting: Internal Medicine

## 2011-03-13 ENCOUNTER — Other Ambulatory Visit: Payer: Self-pay | Admitting: Internal Medicine

## 2011-03-14 ENCOUNTER — Ambulatory Visit (INDEPENDENT_AMBULATORY_CARE_PROVIDER_SITE_OTHER): Payer: Medicare Other | Admitting: Internal Medicine

## 2011-03-14 ENCOUNTER — Encounter: Payer: Self-pay | Admitting: Internal Medicine

## 2011-03-14 DIAGNOSIS — R1319 Other dysphagia: Secondary | ICD-10-CM

## 2011-03-14 DIAGNOSIS — I10 Essential (primary) hypertension: Secondary | ICD-10-CM

## 2011-03-14 DIAGNOSIS — E039 Hypothyroidism, unspecified: Secondary | ICD-10-CM

## 2011-03-14 DIAGNOSIS — R7309 Other abnormal glucose: Secondary | ICD-10-CM

## 2011-03-14 DIAGNOSIS — E782 Mixed hyperlipidemia: Secondary | ICD-10-CM

## 2011-03-14 LAB — CBC WITH DIFFERENTIAL/PLATELET
Basophils Absolute: 0 10*3/uL (ref 0.0–0.1)
Eosinophils Relative: 3.2 % (ref 0.0–5.0)
HCT: 38.9 % — ABNORMAL LOW (ref 39.0–52.0)
Lymphs Abs: 1.9 10*3/uL (ref 0.7–4.0)
MCV: 90.7 fl (ref 78.0–100.0)
Monocytes Absolute: 0.9 10*3/uL (ref 0.1–1.0)
Monocytes Relative: 13.7 % — ABNORMAL HIGH (ref 3.0–12.0)
Neutrophils Relative %: 53.7 % (ref 43.0–77.0)
Platelets: 206 10*3/uL (ref 150.0–400.0)
RDW: 13.3 % (ref 11.5–14.6)
WBC: 6.4 10*3/uL (ref 4.5–10.5)

## 2011-03-14 LAB — HEPATIC FUNCTION PANEL
ALT: 18 U/L (ref 0–53)
AST: 23 U/L (ref 0–37)
Total Bilirubin: 0.8 mg/dL (ref 0.3–1.2)
Total Protein: 8 g/dL (ref 6.0–8.3)

## 2011-03-14 LAB — TSH: TSH: 21.89 u[IU]/mL — ABNORMAL HIGH (ref 0.35–5.50)

## 2011-03-14 LAB — BASIC METABOLIC PANEL
BUN: 18 mg/dL (ref 6–23)
CO2: 25 mEq/L (ref 19–32)
Chloride: 105 mEq/L (ref 96–112)
Creatinine, Ser: 1.1 mg/dL (ref 0.4–1.5)

## 2011-03-14 LAB — HEMOGLOBIN A1C: Hgb A1c MFr Bld: 5.8 % (ref 4.6–6.5)

## 2011-03-14 NOTE — Patient Instructions (Signed)
Preventive Health Care: Exercise at least 30-45 minutes a day,  3-4 days a week.  Eat a low-fat diet with lots of fruits and vegetables, up to 7-9 servings per day. Consume less than 40 grams of sugar per day from foods & drinks with High Fructose Corn Sugar as #2,3 or # 4 on label.   

## 2011-03-14 NOTE — Progress Notes (Signed)
  Subjective:    Patient ID: Ethan Duncan, male    DOB: Apr 22, 1927, 77 y.o.   MRN: [id number]  HPI #1  HYPERTENSION  Disease Monitoring: Blood pressure range-not monitored ; controlled @ medical appts Chest pain, palpitations- no       Dyspnea/PND- no  Medications: Compliance- yes  Lightheadedness,Syncope- no    Edema- no   #2 Fasting Hyperglycemia Disease Monitoring: Blood Sugar ranges-not monitored  Polyuria/phagia/dipsia- no       Visual problems- Macular degeneration followed @ [facility] every 6 weeks  Medications: Compliance- no meds  Hypoglycemic symptoms- no    #3 HYPERLIPIDEMIA  Disease Monitoring: See symptoms for Hypertension  Medications: Compliance- yes  Abd pain, bowel changes- no   Muscle aches- no Memory loss: no   #4 Hypothyroidism : as per [facility]    ROS See HPI above   PMH Smoking Status  :never       Review of Systems     Objective:   Physical Exam Gen.: Healthy and well-nourished in appearance. Alert, appropriate and cooperative throughout exam. Appears younger than age Head: Normocephalic without obvious abnormalities;  No  alopecia  Eyes: No corneal or conjunctival inflammation noted. Slight ptosis OD. Extraocular motion intact. No lid lag Neck: No deformities, masses, or tenderness noted. Thyroid  normal. Lungs: Normal respiratory effort; chest expands symmetrically. Lungs are clear to auscultation without rales, wheezes, or increased work of breathing. Heart: Slow rate and regular  rhythm. Normal S1 and S2. No gallop, click, or rub. Grade 1/6 systolic  murmur. Abdomen: Bowel sounds normal; abdomen soft and nontender. No masses, organomegaly or hernias noted.  .                                                                                   Musculoskeletal/extremities: No deformity or scoliosis noted of  the thoracic or lumbar spine. No clubbing, cyanosis, edema  Noted. OA DIP finger changes  Range of motion  normal .Tone &  strength  normal.Nail health  good. Vascular: Carotid, radial artery, dorsalis pedis and  posterior tibial pulses are full and equal. No bruits present. Neurologic: Alert and oriented x3. Deep tendon reflexes symmetrical and normal. No tremor          Skin: Intact without suspicious lesions or rashes. Lymph: No cervical, axillary lymphadenopathy present. Psych: Mood and affect are normal. Normally interactive                                                                                         Assessment & Plan:  #1 see Problem List with Assessments & Recommendations Plan: see Orders

## 2011-03-24 ENCOUNTER — Other Ambulatory Visit: Payer: Self-pay | Admitting: Internal Medicine

## 2011-03-25 ENCOUNTER — Other Ambulatory Visit: Payer: Self-pay | Admitting: Internal Medicine

## 2011-03-25 NOTE — Telephone Encounter (Signed)
Left message on voicemail for patient to call and schedule Lab appointment (Lipid/no venipuncture charge) 272.4)

## 2011-03-26 ENCOUNTER — Other Ambulatory Visit: Payer: Self-pay | Admitting: Internal Medicine

## 2011-03-26 DIAGNOSIS — E785 Hyperlipidemia, unspecified: Secondary | ICD-10-CM

## 2011-03-27 ENCOUNTER — Other Ambulatory Visit (INDEPENDENT_AMBULATORY_CARE_PROVIDER_SITE_OTHER): Payer: Medicare Other

## 2011-03-27 DIAGNOSIS — E785 Hyperlipidemia, unspecified: Secondary | ICD-10-CM

## 2011-03-27 LAB — LIPID PANEL: Cholesterol: 143 mg/dL (ref 0–200)

## 2011-03-27 NOTE — Progress Notes (Signed)
Labs only

## 2011-03-31 ENCOUNTER — Other Ambulatory Visit: Payer: Medicare Other

## 2011-03-31 DIAGNOSIS — Z1211 Encounter for screening for malignant neoplasm of colon: Secondary | ICD-10-CM

## 2011-03-31 LAB — HEMOCCULT GUIAC POC 1CARD (OFFICE)
Card #3 Fecal Occult Blood, POC: NEGATIVE
Fecal Occult Blood, POC: NEGATIVE

## 2011-03-31 NOTE — Progress Notes (Signed)
Labs only

## 2011-05-31 ENCOUNTER — Other Ambulatory Visit: Payer: Self-pay | Admitting: Internal Medicine

## 2012-02-27 ENCOUNTER — Other Ambulatory Visit: Payer: Self-pay | Admitting: Internal Medicine

## 2012-02-27 NOTE — Telephone Encounter (Signed)
Patient needs to schedule a CPX  

## 2012-03-11 ENCOUNTER — Other Ambulatory Visit: Payer: Self-pay | Admitting: Internal Medicine

## 2012-03-12 NOTE — Telephone Encounter (Signed)
Patient needs to schedule a CPX with fasting labs  

## 2012-04-06 ENCOUNTER — Other Ambulatory Visit: Payer: Self-pay | Admitting: Internal Medicine

## 2012-04-06 NOTE — Telephone Encounter (Signed)
Patient needs to schedule a CPX  

## 2012-05-24 ENCOUNTER — Other Ambulatory Visit: Payer: Self-pay | Admitting: Internal Medicine

## 2012-05-24 NOTE — Telephone Encounter (Signed)
Lipid/Hep 272.4/995.20  

## 2012-05-29 ENCOUNTER — Other Ambulatory Visit: Payer: Self-pay | Admitting: Internal Medicine

## 2012-06-03 ENCOUNTER — Other Ambulatory Visit: Payer: Self-pay | Admitting: Internal Medicine

## 2012-07-09 ENCOUNTER — Other Ambulatory Visit: Payer: Self-pay | Admitting: Internal Medicine

## 2012-07-19 ENCOUNTER — Other Ambulatory Visit: Payer: Self-pay | Admitting: Internal Medicine

## 2012-08-25 ENCOUNTER — Encounter: Payer: Self-pay | Admitting: Internal Medicine

## 2012-08-25 ENCOUNTER — Ambulatory Visit (INDEPENDENT_AMBULATORY_CARE_PROVIDER_SITE_OTHER): Payer: Medicare Other | Admitting: Internal Medicine

## 2012-08-25 DIAGNOSIS — R7309 Other abnormal glucose: Secondary | ICD-10-CM

## 2012-08-25 DIAGNOSIS — E785 Hyperlipidemia, unspecified: Secondary | ICD-10-CM

## 2012-08-25 DIAGNOSIS — Z0000 Encounter for general adult medical examination without abnormal findings: Secondary | ICD-10-CM

## 2012-08-25 DIAGNOSIS — I10 Essential (primary) hypertension: Secondary | ICD-10-CM

## 2012-08-25 DIAGNOSIS — D649 Anemia, unspecified: Secondary | ICD-10-CM

## 2012-08-25 DIAGNOSIS — N429 Disorder of prostate, unspecified: Secondary | ICD-10-CM

## 2012-08-25 NOTE — Patient Instructions (Addendum)
Please  schedule fasting Labs : BMET, NMR Lipoprofile (NOT routine Lipids), hepatic panel, CBC & dif,  PSA (NO TSH). PLEASE BRING THESE INSTRUCTIONS TO FOLLOW UP  LAB APPOINTMENT.This will guarantee correct labs are drawn, eliminating need for repeat blood sampling ( needle sticks ! ). Diagnoses /Codes: 272.4,285.9,790.29,602.9

## 2012-08-25 NOTE — Assessment & Plan Note (Signed)
Gaylyn Lambert MD [facility] TSH 0.90 05/26/2012

## 2012-08-25 NOTE — Progress Notes (Signed)
Subjective:    Patient ID: Ethan Duncan, male    DOB: [birth year], 78 y.o.   MRN: [id number]  HPI Medicare Wellness Visit:  The following psychosocial & medical history were reviewed as required by Medicare.   Social history: caffeine: 2 cups/ day , alcohol:  no ,  tobacco use : no  & exercise : walks 30 min 3X/ week.   Home & personal  safety / fall risk: no issues, activities of daily living:no limitations , seatbelt use :yes , and smoke alarm employment : yes .  Power of Attorney/Living Will status : in place  Vision ( as recorded per Nurse) & Hearing  evaluation :  Ophth exams every 3 mos @ [facility].No hearing exam Orientation :oriented X 3 , memory & recall :good, spelling or math testing: good,and mood & affect : normal. Depression / anxiety: denied Travel history : last 62s [place] & [place] , immunization status :does not take flu shot , transfusion history:  no, and preventive health surveillance ( colonoscopies, BMD , etc as per protocol/ SOC): aged out of colonoscopy survelliance, Dental care:  Seen every 12 mos . Chart reviewed &  Updated. Active issues reviewed & addressed.       Review of Systems HYPERTENSION: Disease Monitoring: Blood pressure range-normal @ MD appts; 138-140/70s  Chest pain, palpitations- no      Dyspnea- no Medications: Compliance- no  Lightheadedness,Syncope-no    Edema- no  FASTING HYPERGLYCEMIA, PMH of:  Polyuria/phagia/dipsia- no       Visual problems- Macular Degeneration  HYPERLIPIDEMIA: Disease Monitoring: See symptoms for Hypertension Medications: Compliance- yes  Abd pain, bowel changes-no   Muscle aches- no        Objective:   Physical Exam Gen.: Healthy and well-nourished in appearance. Alert, appropriate and cooperative throughout exam.Appears younger than stated age  Head: Normocephalic without obvious abnormalities  Eyes: No corneal or conjunctival inflammation noted.  Extraocular motion intact. Vision grossly normal with  lenses Ears: External  ear exam reveals no significant lesions or deformities. There is scarring of the left tympanic membrane and suggestion of a previous perforation as there is a central deficit. Hearing is decreased bilaterally to whisper at 6 feet Nose: External nasal exam reveals no deformity or inflammation. Nasal mucosa are pink and moist. No lesions or exudates noted.  Mouth: Oral mucosa and oropharynx reveal no lesions or exudates. Upper dentures Neck: No deformities, masses, or tenderness noted. Range of motion & Thyroid normal. Lungs: Normal respiratory effort; chest expands symmetrically. Lungs are clear to auscultation without rales, wheezes, or increased work of breathing. Heart: Normal rate and rhythm. Normal S1 and S2. No gallop, click, or rub.Grade 1/6 systolic murmur base to apex. Abdomen: Bowel sounds normal; abdomen soft and nontender. No masses, organomegaly or hernias noted. Genitalia/DRE: Genitalia normal except for left varices. Prostate is 2+ enlarged w/o asymmetry, nodularity, or induration.  Musculoskeletal/extremities: There is slight asymmetry of the posterior thoracic musculature suggesting occult scoliosis.  No clubbing, cyanosis,or  edema. Range of motion  normal .Tone & strength  normal.. Nail health  Good.DIP osteoarthritic finger changes  Vascular: Carotid, radial artery, dorsalis pedis and  posterior tibial pulses are full and equal. No bruits present. Neurologic: Alert and oriented x3. Deep tendon reflexes symmetrical and normal.          Skin: Keratotic lesion R anterior scalp line. Lymph: No cervical, axillary, or inguinal lymphadenopathy present. Psych: Mood and affect are normal. Normally interactive  Assessment & Plan:  #1 Medicare Wellness Exam; criteria met ; data entered #2 asymptomatic significant BPH Plan: see Orders

## 2012-08-31 ENCOUNTER — Other Ambulatory Visit (INDEPENDENT_AMBULATORY_CARE_PROVIDER_SITE_OTHER): Payer: Medicare Other

## 2012-08-31 ENCOUNTER — Ambulatory Visit: Payer: Medicare Other

## 2012-08-31 DIAGNOSIS — E785 Hyperlipidemia, unspecified: Secondary | ICD-10-CM

## 2012-08-31 DIAGNOSIS — R7309 Other abnormal glucose: Secondary | ICD-10-CM

## 2012-08-31 DIAGNOSIS — D649 Anemia, unspecified: Secondary | ICD-10-CM

## 2012-08-31 LAB — CBC WITH DIFFERENTIAL/PLATELET
Basophils Absolute: 0 10*3/uL (ref 0.0–0.1)
Eosinophils Absolute: 0.3 10*3/uL (ref 0.0–0.7)
HCT: 35.6 % — ABNORMAL LOW (ref 39.0–52.0)
Hemoglobin: 11.8 g/dL — ABNORMAL LOW (ref 13.0–17.0)
Lymphs Abs: 1.6 10*3/uL (ref 0.7–4.0)
MCHC: 33.2 g/dL (ref 30.0–36.0)
MCV: 89.4 fl (ref 78.0–100.0)
Neutro Abs: 6.2 10*3/uL (ref 1.4–7.7)
RDW: 13.3 % (ref 11.5–14.6)

## 2012-08-31 LAB — HEPATIC FUNCTION PANEL
Alkaline Phosphatase: 89 U/L (ref 39–117)
Bilirubin, Direct: 0.1 mg/dL (ref 0.0–0.3)

## 2012-08-31 LAB — BASIC METABOLIC PANEL
GFR: 58.03 mL/min — ABNORMAL LOW (ref >60.00)
Glucose, Bld: 100 mg/dL — ABNORMAL HIGH (ref 70–99)
Potassium: 4.5 mEq/L (ref 3.5–5.1)
Sodium: 138 mEq/L (ref 135–145)

## 2012-09-02 ENCOUNTER — Encounter: Payer: Self-pay | Admitting: *Deleted

## 2012-09-02 LAB — NMR, LIPOPROFILE

## 2012-09-09 ENCOUNTER — Encounter: Payer: Self-pay | Admitting: Internal Medicine

## 2012-09-10 ENCOUNTER — Other Ambulatory Visit (INDEPENDENT_AMBULATORY_CARE_PROVIDER_SITE_OTHER): Payer: Medicare Other

## 2012-09-10 DIAGNOSIS — D649 Anemia, unspecified: Secondary | ICD-10-CM

## 2012-09-10 LAB — CBC WITH DIFFERENTIAL/PLATELET
Eosinophils Relative: 4.1 % (ref 0.0–5.0)
Monocytes Relative: 11.2 % (ref 3.0–12.0)
Neutrophils Relative %: 62.8 % (ref 43.0–77.0)
Platelets: 222 10*3/uL (ref 150.0–400.0)
WBC: 7.6 10*3/uL (ref 4.5–10.5)

## 2012-09-10 LAB — VITAMIN B12: Vitamin B-12: 242 pg/mL (ref 211–911)

## 2012-09-10 LAB — IRON: Iron: 73 ug/dL (ref 42–165)

## 2012-09-13 ENCOUNTER — Other Ambulatory Visit: Payer: Self-pay | Admitting: Internal Medicine

## 2012-09-13 NOTE — Telephone Encounter (Signed)
Hopp please advise if rx's to wait until NMR reported back or ok to fill now

## 2012-09-14 NOTE — Telephone Encounter (Signed)
Refill #90 ; R X 1

## 2012-09-14 NOTE — Telephone Encounter (Signed)
The risk of myopathy and rhabdomyolysis may be increased by co-administration of atorvastatin and gemfibrozil. Specifically, co-administration of lovastatin or simvastatin with gemfibrozil should be avoided or is contraindicated in official package labeling. Reduced maximum doses of statins (other than lovastatin or simvastatin) may be recommended for patients receiving gemfibrozil in official package labeling for the other statin products.   Please advise if ok to override warning

## 2012-09-14 NOTE — Telephone Encounter (Signed)
Left message with patient's wife to have patient to return call to schedule a follow-up appointment

## 2012-09-14 NOTE — Telephone Encounter (Signed)
Please see me before refilling these meds ; bring NMR Lipoprofile report. We may decrease or stop one or other med

## 2012-09-16 ENCOUNTER — Other Ambulatory Visit (INDEPENDENT_AMBULATORY_CARE_PROVIDER_SITE_OTHER): Payer: Medicare Other

## 2012-09-16 DIAGNOSIS — Z1289 Encounter for screening for malignant neoplasm of other sites: Secondary | ICD-10-CM

## 2012-09-16 DIAGNOSIS — Z0000 Encounter for general adult medical examination without abnormal findings: Secondary | ICD-10-CM

## 2012-09-17 ENCOUNTER — Encounter: Payer: Self-pay | Admitting: *Deleted

## 2012-09-20 ENCOUNTER — Ambulatory Visit (INDEPENDENT_AMBULATORY_CARE_PROVIDER_SITE_OTHER): Payer: Medicare Other | Admitting: Internal Medicine

## 2012-09-20 ENCOUNTER — Encounter: Payer: Self-pay | Admitting: Internal Medicine

## 2012-09-20 VITALS — BP 124/62 | HR 59 | Wt 166.6 lb

## 2012-09-20 DIAGNOSIS — E782 Mixed hyperlipidemia: Secondary | ICD-10-CM

## 2012-09-20 DIAGNOSIS — D649 Anemia, unspecified: Secondary | ICD-10-CM

## 2012-09-20 DIAGNOSIS — I10 Essential (primary) hypertension: Secondary | ICD-10-CM

## 2012-09-20 MED ORDER — ENALAPRIL MALEATE 20 MG PO TABS: 20.0000 mg | ORAL_TABLET | Freq: Two times a day (BID) | ORAL | Status: DC

## 2012-09-20 NOTE — Patient Instructions (Addendum)
Minimal Blood Pressure Goal= AVERAGE < 140/90;  Ideal is an AVERAGE < 135/85. This AVERAGE should be calculated from @ least 5-7 BP readings taken @ different times of day on different days of week. You should not respond to isolated BP readings , but rather the AVERAGE for that week .Please bring your  blood pressure cuff to office visits to verify that it is reliable.It  can also be checked against the blood pressure device at the pharmacy. Finger or wrist cuffs are not dependable; an arm cuff is. Please review the medication list in the After Visit Summary provided.Please verify the medication name (this may be  brand or generic) & correct dosage. Write the name of the prescribing physician to the right of the medication and share this with all medical staff seen at each appointment. This will help provide continuity of care; help optimize therapeutic interventions;and help prevent drug:drug adverse reaction.  Please  schedule fasting Labs in 10-11 weeks OFF Gemfibrozil: BMET,Lipids, hepatic panel. PLEASE BRING THESE INSTRUCTIONS TO FOLLOW UP  LAB APPOINTMENT.This will guarantee correct labs are drawn, eliminating need for repeat blood sampling ( needle sticks ! ). Diagnoses /Codes: 272.4, 995.20

## 2012-09-20 NOTE — Assessment & Plan Note (Signed)
Triglycerides are exceptionally low; gemfibrozil will be discontinued. Fasting lipids will be checked in 10-12 weeks off gemfibrozil

## 2012-09-20 NOTE — Assessment & Plan Note (Signed)
Anemia is stable dating back 4 years. Vitamin B12 level was low normal at 242. Serum iron levels are normal with negative Hemoccult stools. CBC and differential, B12 level, and stool cards should be monitored annually.  Last colonoscopy was negative 2007

## 2012-09-20 NOTE — Progress Notes (Signed)
  Subjective:    Patient ID: Ethan Duncan, male    DOB: 1927/01/09, 79 y.o.   MRN: [id number]  HPI Dyslipidemia assessment: Prior Advanced Lipid Testing with NMR Lipoprofile   documents LDL goal of  < 100, ideally < 70  No family history of premature CAD/ MI: father MI in 71s On heart healthy diet   Exercising  3nX / week up to  30 minutes as walking. No PMH of Diabetes . HTN not present controlled w/o adverse medication effect No smoking history  Current lab results reviewed ;risks & options discussed       Review of Systems Weight stable  No significant fatigue, chest pain,claudication,palpitations, abd pain/bowel changes, myalgias, syncope , memory loss,or skin changes      Objective:   Physical Exam He appears healthy , well-nourished & younger than stated age.He is in no acute distress  No carotid bruits are present.  Heart rhythm and rate are normal with Grade 1/6 systolic murmur @ base.  Chest is clear with no increased work of breathing  There is no evidence of aortic aneurysm or renal artery bruits. No organomegaly  He has no clubbing or edema.   Pedal pulses are intact   No ischemic skin changes are present         Assessment & Plan:

## 2012-09-20 NOTE — Assessment & Plan Note (Signed)
Blood pressure goals discussed; home monitoring encouraged.

## 2012-09-21 ENCOUNTER — Other Ambulatory Visit: Payer: Self-pay | Admitting: Internal Medicine

## 2012-09-21 NOTE — Telephone Encounter (Signed)
Awaiting lab results

## 2012-10-12 ENCOUNTER — Other Ambulatory Visit: Payer: Self-pay | Admitting: Internal Medicine

## 2012-10-13 ENCOUNTER — Encounter: Payer: Self-pay | Admitting: Internal Medicine

## 2012-11-25 ENCOUNTER — Telehealth: Payer: Self-pay | Admitting: Internal Medicine

## 2012-11-25 MED ORDER — ATORVASTATIN CALCIUM 20 MG PO TABS: ORAL_TABLET | ORAL | Status: DC

## 2012-11-25 NOTE — Telephone Encounter (Signed)
RX sent in electronically, last cholesterol check was 08/2012

## 2012-12-13 ENCOUNTER — Other Ambulatory Visit (INDEPENDENT_AMBULATORY_CARE_PROVIDER_SITE_OTHER): Payer: Medicare Other

## 2012-12-13 DIAGNOSIS — E785 Hyperlipidemia, unspecified: Secondary | ICD-10-CM

## 2012-12-13 DIAGNOSIS — T887XXA Unspecified adverse effect of drug or medicament, initial encounter: Secondary | ICD-10-CM

## 2012-12-13 LAB — LIPID PANEL
Cholesterol: 130 mg/dL (ref 0–200)
Triglycerides: 122 mg/dL (ref 0.0–149.0)
VLDL: 24.4 mg/dL (ref 0.0–40.0)

## 2012-12-13 LAB — HEPATIC FUNCTION PANEL
ALT: 20 U/L (ref 0–53)
Albumin: 4.2 g/dL (ref 3.5–5.2)
Alkaline Phosphatase: 69 U/L (ref 39–117)
Bilirubin, Direct: 0.1 mg/dL (ref 0.0–0.3)
Total Protein: 7.5 g/dL (ref 6.0–8.3)

## 2012-12-13 LAB — BASIC METABOLIC PANEL
BUN: 14 mg/dL (ref 6–23)
Chloride: 103 mEq/L (ref 96–112)
GFR: 67.13 mL/min (ref >60.00)
Potassium: 3.9 mEq/L (ref 3.5–5.1)

## 2013-01-08 ENCOUNTER — Emergency Department (HOSPITAL_BASED_OUTPATIENT_CLINIC_OR_DEPARTMENT_OTHER)
Admission: EM | Admit: 2013-01-08 | Discharge: 2013-01-08 | Disposition: A | Discharge status: Home / Self Care | Payer: Medicare Other | Attending: Emergency Medicine | Admitting: Emergency Medicine

## 2013-01-08 ENCOUNTER — Encounter (HOSPITAL_BASED_OUTPATIENT_CLINIC_OR_DEPARTMENT_OTHER): Payer: Self-pay | Admitting: Emergency Medicine

## 2013-01-08 DIAGNOSIS — H538 Other visual disturbances: Secondary | ICD-10-CM

## 2013-01-08 DIAGNOSIS — Z872 Personal history of diseases of the skin and subcutaneous tissue: Secondary | ICD-10-CM | POA: Insufficient documentation

## 2013-01-08 DIAGNOSIS — I10 Essential (primary) hypertension: Secondary | ICD-10-CM | POA: Insufficient documentation

## 2013-01-08 DIAGNOSIS — E785 Hyperlipidemia, unspecified: Secondary | ICD-10-CM | POA: Insufficient documentation

## 2013-01-08 DIAGNOSIS — Z79899 Other long term (current) drug therapy: Secondary | ICD-10-CM | POA: Insufficient documentation

## 2013-01-08 MED ORDER — TETRACAINE HCL 0.5 % OP SOLN
2.0000 [drp] | Freq: Once | OPHTHALMIC | Status: DC
  Filled 2013-01-08: qty 2

## 2013-01-08 MED ORDER — FLUORESCEIN SODIUM 1 MG OP STRP
1.0000 | ORAL_STRIP | Freq: Once | OPHTHALMIC | Status: DC
  Filled 2013-01-08: qty 1

## 2013-01-08 NOTE — ED Notes (Addendum)
Pt sts his macular degeneration has gotten bad tonight - he sat down to start reading and things got blurry. His doc is in [place]. No pain. Sts there is a large round "cloud" in the center of his vision that in certain lights turns blue - this is a new symptom.

## 2013-01-08 NOTE — ED Provider Notes (Signed)
History     CSN: [id number]  Arrival date & time 01/08/13  0011   First MD Initiated Contact with Patient 01/08/13 0045      Chief Complaint  Patient presents with  . Macular Degeneration    (Consider location/radiation/quality/duration/timing/severity/associated sxs/prior treatment) HPI Pt presenting with c/o blurry vision in his left eye.  He has hx of macular degeneration and is followed at [facility] for this.  He felt that when he was reading tonight his left eye seemed more blurred to him than usual.  No pain to eye.  No trauma to head or eye.  No changes in speech, no focal weakness.  He states he had similar symptoms in right eye approx 10 years ago and had laser surgery at [facility] which helped.  He has been followed for macular degeneration since that time.  Symptoms are continuous.  He has had no treatment prior to arrival.  No feeling of shade being pulled down, no visual field deficits, just generally more blurry.  There are no other associated systemic symptoms, there are no other alleviating or modifying factors.   Past Medical History  Diagnosis Date  . Hyperlipidemia   . Hypertension   . Psoriasis   . Macular degeneration     [facility]; Dr Mina Marble    Past Surgical History  Procedure Laterality Date  . Mastoidectomy    . Inguinal hernia repair    . Tonsillectomy and adenoidectomy    . Colonoscopy  2007    negative ; Dr Jarold Motto    Family History  Problem Relation Age of Onset  . Diabetes Father   . Colon cancer Mother 38  . Heart disease Neg Hx     Father had "scarring" @ autopsy  . Stroke Neg Hx     History  Substance Use Topics  . Smoking status: Never Smoker   . Smokeless tobacco: Not on file  . Alcohol Use: No      Review of Systems ROS reviewed and all otherwise negative except for mentioned in HPI  Allergies  Review of patient's allergies indicates no known allergies.  Home Medications   Current Outpatient Rx  Name  Route  Sig  Dispense   Refill  . atorvastatin (LIPITOR) 20 MG tablet      take 1/2 tablet by mouth once daily ON MONDAYS, WEDNESDAYS, FRIDAYS AND SUNDAYS AFTER LUNCH   30 tablet   2   . enalapril (VASOTEC) 20 MG tablet   Oral   Take 1 tablet (20 mg total) by mouth 2 (two) times daily.   180 tablet   1   . Flaxseed, Linseed, (FLAXSEED OIL PO)   Oral   Take by mouth daily.           Marland Kitchen levothyroxine (SYNTHROID, LEVOTHROID) 100 MCG tablet   Oral   Take 100 mcg by mouth. 1 by mouth daily EXCEPT 1/2 on Sunday.  RX'ed by Specialist         . metoprolol (LOPRESSOR) 50 MG tablet      take 1/2 tablet by mouth twice a day   90 tablet   1   . Multiple Vitamins-Minerals (OCUVITE PO)   Oral   Take by mouth. 2 by mouth two times daily            BP 175/56  Pulse 58  Temp(Src) 98.4 F (36.9 C)  Resp 16  SpO2 94% Vitals reviewed Physical Exam Physical Examination: General appearance - alert, well appearing, and in  no distress Mental status - alert, oriented to person, place, and time Eyes - pupils equal and reactive, extraocular eye movements intact, no scleral icterus, no conjunctival injection, difficult to see fundi Mouth - mucous membranes moist, pharynx normal without lesions Chest - clear to auscultation, no wheezes, rales or rhonchi, symmetric air entry Heart - normal rate, regular rhythm, normal S1, S2, no murmurs, rubs, clicks or gallops Neurological - alert, oriented, normal speech, cranial nerves 2-12 grossly intact, strength and sensation symmetric in extremities x 4 Extremities - peripheral pulses normal, no pedal edema, no clubbing or cyanosis Skin - normal coloration and turgor, no rashes  ED Course  Procedures (including critical care time)  IOP of left eye measured with tonopen- average pressure of 11, 11 obtained x 2 separate tests.    Labs Reviewed - No data to display No results found.   1. Blurred vision, left eye       MDM  Pt with hx of macular degeneration  presents with worsening blurriness in left eye.  No pain, vision is 20/50 right eye, 20/70 left eye.  IOP is normal - 11- in left eye.  No trauma to eye. Doubt acute angle closure glaucoma, iritis, ruptured globe, or other acute emergent condition at this time.  Pt has an opthalmologist- advised to call for appointment with optho.  Discharged with strict return precautions.  Pt agreeable with plan.        Ethelda Chick, MD 01/08/13 [id number]

## 2013-01-08 NOTE — ED Notes (Signed)
Dr. Karma Ganja to check pressure in Pt. Eye due to L eye with c/o blurred vision.

## 2013-04-06 ENCOUNTER — Other Ambulatory Visit: Payer: Self-pay | Admitting: Internal Medicine

## 2013-04-23 ENCOUNTER — Other Ambulatory Visit: Payer: Self-pay | Admitting: Internal Medicine

## 2013-09-08 ENCOUNTER — Other Ambulatory Visit: Payer: Self-pay | Admitting: Internal Medicine

## 2013-09-09 NOTE — Telephone Encounter (Signed)
Enalapril and Metoprolol refilled per protocol. JG//CMA

## 2013-10-12 ENCOUNTER — Other Ambulatory Visit: Payer: Self-pay | Admitting: Internal Medicine

## 2013-10-13 NOTE — Telephone Encounter (Signed)
Med filled.  

## 2013-10-17 ENCOUNTER — Other Ambulatory Visit: Payer: Self-pay | Admitting: Internal Medicine

## 2013-10-18 NOTE — Telephone Encounter (Signed)
Rx denied because it has been filled on (09-08-13).//AB/CMA
